# Patient Record
Sex: Male | Born: 1960 | Race: White | Hispanic: No | Smoking: Current every day smoker
Health system: Southern US, Community
[De-identification: ages and names within clinical notes are randomized; demographics above are authoritative.]

## PROBLEM LIST (undated history)

## (undated) DIAGNOSIS — F419 Anxiety disorder, unspecified: Secondary | ICD-10-CM

## (undated) DIAGNOSIS — G43909 Migraine, unspecified, not intractable, without status migrainosus: Secondary | ICD-10-CM

## (undated) DIAGNOSIS — B20 Human immunodeficiency virus [HIV] disease: Secondary | ICD-10-CM

## (undated) DIAGNOSIS — E785 Hyperlipidemia, unspecified: Secondary | ICD-10-CM

---

## 1988-08-25 DIAGNOSIS — B20 Human immunodeficiency virus [HIV] disease: Secondary | ICD-10-CM

## 1988-08-25 DIAGNOSIS — Z21 Asymptomatic human immunodeficiency virus [HIV] infection status: Secondary | ICD-10-CM

## 1988-08-25 HISTORY — DX: Asymptomatic human immunodeficiency virus (hiv) infection status: Z21

## 1988-08-25 HISTORY — DX: Human immunodeficiency virus (HIV) disease: B20

## 2012-11-30 ENCOUNTER — Emergency Department (INDEPENDENT_AMBULATORY_CARE_PROVIDER_SITE_OTHER)
Admission: EM | Admit: 2012-11-30 | Discharge: 2012-11-30 | Disposition: A | Discharge status: Home / Self Care | Payer: Medicare Other | Source: Home / Self Care | Attending: Family Medicine | Admitting: Family Medicine

## 2012-11-30 ENCOUNTER — Emergency Department (INDEPENDENT_AMBULATORY_CARE_PROVIDER_SITE_OTHER): Payer: Medicare Other

## 2012-11-30 ENCOUNTER — Encounter: Payer: Self-pay | Admitting: *Deleted

## 2012-11-30 DIAGNOSIS — J3489 Other specified disorders of nose and nasal sinuses: Secondary | ICD-10-CM

## 2012-11-30 DIAGNOSIS — R05 Cough: Secondary | ICD-10-CM

## 2012-11-30 DIAGNOSIS — R51 Headache: Secondary | ICD-10-CM

## 2012-11-30 DIAGNOSIS — J069 Acute upper respiratory infection, unspecified: Secondary | ICD-10-CM

## 2012-11-30 DIAGNOSIS — R059 Cough, unspecified: Secondary | ICD-10-CM

## 2012-11-30 DIAGNOSIS — B20 Human immunodeficiency virus [HIV] disease: Secondary | ICD-10-CM

## 2012-11-30 HISTORY — DX: Human immunodeficiency virus (HIV) disease: B20

## 2012-11-30 LAB — POCT CBC W AUTO DIFF (K'VILLE URGENT CARE)

## 2012-11-30 MED ORDER — BENZONATATE 200 MG PO CAPS: 200.0000 mg | ORAL_CAPSULE | Freq: Every day | ORAL | Status: AC

## 2012-11-30 MED ORDER — CEFDINIR 300 MG PO CAPS: 300.0000 mg | ORAL_CAPSULE | Freq: Two times a day (BID) | ORAL | Status: AC

## 2012-11-30 NOTE — ED Notes (Signed)
Pt c/o cough, runny nose, HA, and night sweats x 4 days. Denies fever. No OTC meds.

## 2012-11-30 NOTE — ED Provider Notes (Signed)
History     CSN: [id number]  Arrival date & time 11/30/12  4098   First MD Initiated Contact with Patient 11/30/12 1906      Chief Complaint  Patient presents with  . Cough  . Headache  . Nasal Congestion       HPI Comments: Patient complains of onset of URI symptoms about four days ago including mild sore throat, sinus congestion, cough, night sweats, fatigue, and headache.  His cough is non-productive, and worse at night.  No pleuritic pain but has tightness in anterior chest.   He has HIV, having [place]  He continues to followup with his HIV clinic in [place] every 3 months, and would like to find a local HIV clinic.  He states that his condition has been under good control.  His last CD4 count about 4 months ago was 284  The history is provided by the patient.    Past Medical History  Diagnosis Date  . HIV (human immunodeficiency virus infection) 1990    History reviewed. No pertinent past surgical history.  Family History  Problem Relation Age of Onset  . Diabetes Mother   . Non-Hodgkin's lymphoma Mother   . Renal Disease Father     History  Substance Use Topics  . Smoking status: Current Every Day Smoker -- 0.25 packs/day  . Smokeless tobacco: Not on file  . Alcohol Use: No      Review of Systems + sore throat + cough No pleuritic pain, but has mild anterior chest tightness No wheezing + nasal congestion + post-nasal drainage No sinus pain/pressure No itchy/red eyes No earache No hemoptysis No SOB No fever, + chills/sweats No nausea No vomiting No abdominal pain No diarrhea No urinary symptoms No skin rashes + fatigue No myalgias + headache Used OTC meds without relief   Allergies  Bactrim and Sulfa antibiotics  Home Medications   Current Outpatient Rx  Name  Route  Sig  Dispense  Refill  . abacavir-lamiVUDine (EPZICOM) 600-300 MG per tablet   Oral   Take 1 tablet by mouth daily.         Marland Kitchen ALPRAZolam  (XANAX) 1 MG tablet   Oral   Take 1 mg by mouth at bedtime as needed for sleep.         Marland Kitchen atazanavir (REYATAZ) 200 MG capsule   Oral   Take 200 mg by mouth daily with breakfast.         . B Complex-C-Folic Acid (STROVITE PO)   Oral   Take by mouth.         . ergocalciferol (VITAMIN D2) 50000 UNITS capsule   Oral   Take 50,000 Units by mouth once a week.         . maraviroc (SELZENTRY) 300 MG tablet   Oral   Take 300 mg by mouth.         . mometasone (NASONEX) 50 MCG/ACT nasal spray   Nasal   Place 2 sprays into the nose daily.         . rosuvastatin (CRESTOR) 20 MG tablet   Oral   Take 20 mg by mouth daily.         . SUMAtriptan (IMITREX) 20 MG/ACT nasal spray   Nasal   Place 1 spray into the nose every 2 (two) hours as needed for migraine.         Marland Kitchen testosterone (ANDROGEL) 50 MG/5GM GEL   Transdermal   Place 5  g onto the skin daily.         . valACYclovir (VALTREX) 1000 MG tablet   Oral   Take 1,000 mg by mouth 2 (two) times daily.         Marland Kitchen venlafaxine XR (EFFEXOR-XR) 150 MG 24 hr capsule   Oral   Take 150 mg by mouth daily.         . benzonatate (TESSALON) 200 MG capsule   Oral   Take 1 capsule (200 mg total) by mouth at bedtime.   12 capsule   0   . cefdinir (OMNICEF) 300 MG capsule   Oral   Take 1 capsule (300 mg total) by mouth 2 (two) times daily.   20 capsule   0     BP 124/70  Pulse 67  Temp(Src) 98.2 F (36.8 C) (Oral)  Resp 16  Ht 5' 10.5" (1.791 m)  Wt 161 lb (73.029 kg)  BMI 22.77 kg/m2  SpO2 97%  Physical Exam Nursing notes and Vital Signs reviewed. Appearance:  Patient appears stated age, and in no acute distress Eyes:  Pupils are equal, round, and reactive to light and accomodation.  Extraocular movement is intact.  Conjunctivae are not inflamed  Ears:  Canals normal.  Tympanic membranes normal.  Nose:  Mildly congested turbinates.  No sinus tenderness.   Mouth:  Partly edentulous Pharynx:  Normal Neck:   Supple.  Slightly tender shotty posterior nodes are palpated bilaterally  Lungs:  Clear to auscultation.  Breath sounds are equal.  Chest:   Mild tenderness to palpation over the mid-sternum.  Heart:  Regular rate and rhythm without murmurs, rubs, or gallops.  Abdomen:  Nontender without masses or hepatosplenomegaly.  Bowel sounds are present.  No CVA or flank tenderness.  Extremities:  No edema.  No calf tenderness Skin:  No rash present.    ED Course  Procedures  none  Labs Reviewed  LACTATE DEHYDROGENASE 197 (WNL)  POCT CBC W AUTO DIFF ([facility]) :  WBC 9.0; LY 45.5; MO 3.0; GR 51.5; Hgb 11.9; Platelets 233    Diagnosis: 1.  Acute upper respiratory infection of unspecified site; suspect viral URI 2.  Human immunodeficiency virus (HIV) disease.   Patient's normal white blood count is reassuring. With a total lymphocyte count of 4100, patient's CD4 count should be greater than 200.  Note mild anemia (Hgb 11.9)       MDM  Empirically begin Omnicef.  Prescription written for Benzonatate [facility]) to take at bedtime for night-time cough.   Take plain Mucinex (guaifenesin) twice daily for cough and congestion.  Increase fluid intake, rest. May use Afrin nasal spray (or generic oxymetazoline) twice daily for about 5 days.  Also recommend using saline nasal spray several times daily and saline nasal irrigation (AYR is a common brand) Stop all antihistamines for now, and other non-prescription cough/cold preparations. Followup with Family Doctor if not improved in about 6 days.  Will attempt to arrange local follow-up HIV care for patient       Lattie Haw, MD 12/04/12 1845

## 2012-12-01 LAB — LACTATE DEHYDROGENASE: LDH: 197 U/L (ref 94–250)

## 2012-12-05 ENCOUNTER — Telehealth: Payer: Self-pay | Admitting: Emergency Medicine

## 2013-01-25 ENCOUNTER — Emergency Department (INDEPENDENT_AMBULATORY_CARE_PROVIDER_SITE_OTHER)
Admission: EM | Admit: 2013-01-25 | Discharge: 2013-01-25 | Disposition: A | Discharge status: Home / Self Care | Payer: Medicare Other | Source: Home / Self Care | Attending: Family Medicine | Admitting: Family Medicine

## 2013-01-25 ENCOUNTER — Encounter: Payer: Self-pay | Admitting: *Deleted

## 2013-01-25 DIAGNOSIS — S61209A Unspecified open wound of unspecified finger without damage to nail, initial encounter: Secondary | ICD-10-CM

## 2013-01-25 DIAGNOSIS — Z23 Encounter for immunization: Secondary | ICD-10-CM

## 2013-01-25 DIAGNOSIS — S61211A Laceration without foreign body of left index finger without damage to nail, initial encounter: Secondary | ICD-10-CM

## 2013-01-25 MED ORDER — TETANUS-DIPHTH-ACELL PERTUSSIS 5-2.5-18.5 LF-MCG/0.5 IM SUSP
0.5000 mL | Freq: Once | INTRAMUSCULAR | Status: AC
  Administered 2013-01-25: 0.5 mL via INTRAMUSCULAR

## 2013-01-25 NOTE — ED Notes (Signed)
Pt c/o LT 2nd finger laceration x today. He reports cutting it with a pair of scissors at work. Tdap is not up to date.

## 2013-01-25 NOTE — ED Provider Notes (Signed)
History     CSN: [id number]  Arrival date & time 01/25/13  1253   First MD Initiated Contact with Patient 01/25/13 1312      Chief Complaint  Patient presents with  . 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  His Tdap is not current.  Patient is a 52 y.o. male presenting with skin laceration. The history is provided by the patient.  Laceration Location:  Finger Finger laceration location:  L index finger Length (cm):  1cm by 5mm Depth:  Through dermis Quality comment:  Flap Bleeding: controlled   Time since incident:  1 hour Injury mechanism: sharp scissors. Pain details:    Quality:  Aching   Severity:  Mild   Timing:  Constant   Progression:  Unchanged Foreign body present:  No foreign bodies Relieved by:  Nothing Tetanus status:  Out of date   Past Medical History  Diagnosis Date  . HIV (human immunodeficiency virus infection) 1990    History reviewed. No pertinent past surgical history.  Family History  Problem Relation Age of Onset  . Diabetes Mother   . Non-Hodgkin's lymphoma Mother   . Renal Disease Father     History  Substance Use Topics  . Smoking status: Current Every Day Smoker -- 0.25 packs/day  . Smokeless tobacco: Not on file  . Alcohol Use: No      Review of Systems  All other systems reviewed and are negative.    Allergies  Bactrim and Sulfa antibiotics  Home Medications   Current Outpatient Rx  Name  Route  Sig  Dispense  Refill  . abacavir-lamiVUDine (EPZICOM) 600-300 MG per tablet   Oral   Take 1 tablet by mouth daily.         Marland Kitchen ALPRAZolam (XANAX) 1 MG tablet   Oral   Take 1 mg by mouth at bedtime as needed for sleep.         Marland Kitchen atazanavir (REYATAZ) 200 MG capsule   Oral   Take 200 mg by mouth daily with breakfast.         . B Complex-C-Folic Acid (STROVITE PO)   Oral   Take by mouth.         .  benzonatate (TESSALON) 200 MG capsule   Oral   Take 1 capsule (200 mg total) by mouth at bedtime.   12 capsule   0   . cefdinir (OMNICEF) 300 MG capsule   Oral   Take 1 capsule (300 mg total) by mouth 2 (two) times daily.   20 capsule   0   . ergocalciferol (VITAMIN D2) 50000 UNITS capsule   Oral   Take 50,000 Units by mouth once a week.         . maraviroc (SELZENTRY) 300 MG tablet   Oral   Take 300 mg by mouth.         . mometasone (NASONEX) 50 MCG/ACT nasal spray   Nasal   Place 2 sprays into the nose daily.         . rosuvastatin (CRESTOR) 20 MG tablet   Oral   Take 20 mg by mouth daily.         . SUMAtriptan (IMITREX) 20 MG/ACT nasal spray   Nasal   Place 1 spray into the nose every 2 (two) hours as needed for migraine.         Marland Kitchen  testosterone (ANDROGEL) 50 MG/5GM GEL   Transdermal   Place 5 g onto the skin daily.         . valACYclovir (VALTREX) 1000 MG tablet   Oral   Take 1,000 mg by mouth 2 (two) times daily.         Marland Kitchen venlafaxine XR (EFFEXOR-XR) 150 MG 24 hr capsule   Oral   Take 150 mg by mouth daily.           BP 125/77  Pulse 64  Temp(Src) 99.1 F (37.3 C) (Oral)  Resp 18  SpO2 98%  Physical Exam  Nursing note and vitals reviewed. Constitutional: He is oriented to person, place, and time. He appears well-developed and well-nourished. No distress.  HENT:  Head: Atraumatic.  Eyes: Conjunctivae are normal. Pupils are equal, round, and reactive to light.  Musculoskeletal:       Left hand: He exhibits laceration. He exhibits normal range of motion, no tenderness, no bony tenderness, normal two-point discrimination, normal capillary refill, no deformity and no swelling. Normal sensation noted. Normal strength noted.       Hands: Left hand, second finger has a superficial narrow flap laceration distal phalanx ulnar side.  The flap is about 1cm long.  Left second finger has full range of motion all joints.  Distal neurovascular  function is intact.   Neurological: He is alert and oriented to person, place, and time.  Skin: Skin is warm and dry.    ED Course  Procedures  Laceration Repair Discussed benefits and risks of procedure and verbal consent obtained. Using sterile technique and local digital 2% lidocaine without epinephrine, cleansed wound with Betadine followed by copious lavage with normal saline.  Wound carefully inspected for debris and foreign bodies; none found.  Wound closed with #6, 5-0 interrupted nylon sutures.  Bacitracin and non-stick sterile dressing applied.  Wound precautions explained to patient.  Return for suture removal in 10 days.       1. Laceration of index finger of left hand without complication, initial encounter       MDM  Tdap administered. Change dressing daily and apply Bacitracin ointment to wound.  Keep wound clean and dry.  Return for any signs of infection (or follow-up with family doctor):  Increasing redness, swelling, pain, heat, drainage, etc. Return in 10 days for suture removal.          Lattie Haw, MD 01/25/13 1414

## 2013-02-07 ENCOUNTER — Emergency Department
Admission: EM | Admit: 2013-02-07 | Discharge: 2013-02-07 | Disposition: A | Discharge status: Home / Self Care | Payer: Medicare Other | Source: Home / Self Care | Attending: Family Medicine | Admitting: Family Medicine

## 2013-02-07 DIAGNOSIS — Z4802 Encounter for removal of sutures: Secondary | ICD-10-CM

## 2013-02-07 NOTE — ED Notes (Signed)
Shawn Jimenez is here for suture removal from left index finger. Site is unremarkable without drainage.

## 2013-02-07 NOTE — ED Provider Notes (Signed)
History     CSN: [id number]  Arrival date & time 02/07/13  1514   First MD Initiated Contact with Patient 02/07/13 1519      Chief Complaint  Patient presents with  . Suture / Staple Removal   HPI Comments: No redness, purulent drainage, fevers, chills   Patient is a 52 y.o. male presenting with suture removal.  Suture / Staple Removal The current episode started more than 1 week ago. Nothing aggravates the symptoms.     Past Medical History  Diagnosis Date  . HIV (human immunodeficiency virus infection) 1990    No past surgical history on file.  Family History  Problem Relation Age of Onset  . Diabetes Mother   . Non-Hodgkin's lymphoma Mother   . Renal Disease Father     History  Substance Use Topics  . Smoking status: Current Every Day Smoker -- 0.25 packs/day  . Smokeless tobacco: Not on file  . Alcohol Use: No      Review of Systems  All other systems reviewed and are negative.    Allergies  Bactrim and Sulfa antibiotics  Home Medications   Current Outpatient Rx  Name  Route  Sig  Dispense  Refill  . abacavir-lamiVUDine (EPZICOM) 600-300 MG per tablet   Oral   Take 1 tablet by mouth daily.         Marland Kitchen ALPRAZolam (XANAX) 1 MG tablet   Oral   Take 1 mg by mouth at bedtime as needed for sleep.         Marland Kitchen atazanavir (REYATAZ) 200 MG capsule   Oral   Take 200 mg by mouth daily with breakfast.         . B Complex-C-Folic Acid (STROVITE PO)   Oral   Take by mouth.         . benzonatate (TESSALON) 200 MG capsule   Oral   Take 1 capsule (200 mg total) by mouth at bedtime.   12 capsule   0   . cefdinir (OMNICEF) 300 MG capsule   Oral   Take 1 capsule (300 mg total) by mouth 2 (two) times daily.   20 capsule   0   . ergocalciferol (VITAMIN D2) 50000 UNITS capsule   Oral   Take 50,000 Units by mouth once a week.         . maraviroc (SELZENTRY) 300 MG tablet   Oral   Take 300 mg by mouth.         . mometasone (NASONEX) 50  MCG/ACT nasal spray   Nasal   Place 2 sprays into the nose daily.         . rosuvastatin (CRESTOR) 20 MG tablet   Oral   Take 20 mg by mouth daily.         . SUMAtriptan (IMITREX) 20 MG/ACT nasal spray   Nasal   Place 1 spray into the nose every 2 (two) hours as needed for migraine.         Marland Kitchen testosterone (ANDROGEL) 50 MG/5GM GEL   Transdermal   Place 5 g onto the skin daily.         . valACYclovir (VALTREX) 1000 MG tablet   Oral   Take 1,000 mg by mouth 2 (two) times daily.         Marland Kitchen venlafaxine XR (EFFEXOR-XR) 150 MG 24 hr capsule   Oral   Take 150 mg by mouth daily.  Temp(Src) 98.4 F (36.9 C) (Oral)  Physical Exam  Constitutional: He appears well-developed and well-nourished.  HENT:  Head: Normocephalic and atraumatic.  Eyes: Conjunctivae are normal. Pupils are equal, round, and reactive to light.  Neck: Normal range of motion.  Cardiovascular: Normal rate and regular rhythm.   Pulmonary/Chest: Effort normal.  Abdominal: Soft.  Musculoskeletal: Normal range of motion.  Neurological: He is alert.  Skin: Skin is warm.    ED Course  Procedures (including critical care time)  Labs Reviewed - No data to display No results found.   1. Visit for suture removal       MDM  Suture removal at bedside. Discussed infectious red flags  Follow up as needed.     The patient and/or caregiver has been counseled thoroughly with regard to treatment plan and/or medications prescribed including dosage, schedule, interactions, rationale for use, and possible side effects and they verbalize understanding. Diagnoses and expected course of recovery discussed and will return if not improved as expected or if the condition worsens. Patient and/or caregiver verbalized understanding.             Doree Albee, MD 02/07/13 [phone]

## 2013-12-04 ENCOUNTER — Encounter: Payer: Self-pay | Admitting: Emergency Medicine

## 2013-12-04 ENCOUNTER — Emergency Department (INDEPENDENT_AMBULATORY_CARE_PROVIDER_SITE_OTHER)
Admission: EM | Admit: 2013-12-04 | Discharge: 2013-12-04 | Disposition: A | Discharge status: Home / Self Care | Payer: Medicare Other | Source: Home / Self Care | Attending: Family Medicine | Admitting: Family Medicine

## 2013-12-04 DIAGNOSIS — H6090 Unspecified otitis externa, unspecified ear: Secondary | ICD-10-CM

## 2013-12-04 DIAGNOSIS — H6690 Otitis media, unspecified, unspecified ear: Secondary | ICD-10-CM

## 2013-12-04 HISTORY — DX: Anxiety disorder, unspecified: F41.9

## 2013-12-04 HISTORY — DX: Hyperlipidemia, unspecified: E78.5

## 2013-12-04 HISTORY — DX: Migraine, unspecified, not intractable, without status migrainosus: G43.909

## 2013-12-04 MED ORDER — NEOMYCIN-POLYMYXIN-HC 3.5-10000-1 OT SOLN: 3.0000 [drp] | Freq: Four times a day (QID) | OTIC | Status: AC

## 2013-12-04 MED ORDER — NEOMYCIN-POLYMYXIN-HC 3.5-10000-1 OT SOLN: 3.0000 [drp] | Freq: Four times a day (QID) | OTIC | Status: DC

## 2013-12-04 MED ORDER — AMOXICILLIN-POT CLAVULANATE 875-125 MG PO TABS: 1.0000 | ORAL_TABLET | Freq: Two times a day (BID) | ORAL | Status: AC

## 2013-12-04 NOTE — ED Provider Notes (Addendum)
CSN: [id number]     Arrival date & time 12/04/13  1611 History   First MD Initiated Contact with Patient 12/04/13 1611     Chief Complaint  Patient presents with  . Ear Fullness    HPI  EAR PAIN Location:  R ear  Description: R ear pain, fullness Onset:  3-5 days  Modifying factors: baseline HIV, last CD4 count >550 Symptoms  Sensation of fullness: yes Ear discharge: no URI symptoms: no  Fever: no Tinnitus:no   Dizziness:no   Hearing loss:minimal    Toothache: no Rashes or lesions: no Facial muscle weakness: no  Red Flags Recent trauma: no PMH prior ear surgery:  no Diabetes or Immunosuppresion: yes    Past Medical History  Diagnosis Date  . HIV (human immunodeficiency virus infection) 1990   No past surgical history on file. Family History  Problem Relation Age of Onset  . Diabetes Mother   . Non-Hodgkin's lymphoma Mother   . Renal Disease Father    History  Substance Use Topics  . Smoking status: Current Every Day Smoker -- 0.25 packs/day  . Smokeless tobacco: Not on file  . Alcohol Use: No    Review of Systems  All other systems reviewed and are negative.   Allergies  Bactrim and Sulfa antibiotics  Home Medications   Current Outpatient Rx  Name  Route  Sig  Dispense  Refill  . abacavir-lamiVUDine (EPZICOM) 600-300 MG per tablet   Oral   Take 1 tablet by mouth daily.         Marland Kitchen ALPRAZolam (XANAX) 1 MG tablet   Oral   Take 1 mg by mouth at bedtime as needed for sleep.         Marland Kitchen amoxicillin-clavulanate (AUGMENTIN) 875-125 MG per tablet   Oral   Take 1 tablet by mouth 2 (two) times daily.   20 tablet   0   . atazanavir (REYATAZ) 200 MG capsule   Oral   Take 200 mg by mouth daily with breakfast.         . B Complex-C-Folic Acid (STROVITE PO)   Oral   Take by mouth.         . benzonatate (TESSALON) 200 MG capsule   Oral   Take 1 capsule (200 mg total) by mouth at bedtime.   12 capsule   0   . cefdinir (OMNICEF) 300 MG  capsule   Oral   Take 1 capsule (300 mg total) by mouth 2 (two) times daily.   20 capsule   0   . ergocalciferol (VITAMIN D2) 50000 UNITS capsule   Oral   Take 50,000 Units by mouth once a week.         . maraviroc (SELZENTRY) 300 MG tablet   Oral   Take 300 mg by mouth.         . mometasone (NASONEX) 50 MCG/ACT nasal spray   Nasal   Place 2 sprays into the nose daily.         Marland Kitchen neomycin-polymyxin-hydrocortisone (CORTISPORIN) otic solution   Right Ear   Place 3 drops into the right ear 4 (four) times daily.   10 mL   0   . rosuvastatin (CRESTOR) 20 MG tablet   Oral   Take 20 mg by mouth daily.         . SUMAtriptan (IMITREX) 20 MG/ACT nasal spray   Nasal   Place 1 spray into the nose every 2 (two) hours as needed for migraine.         Marland Kitchen  testosterone (ANDROGEL) 50 MG/5GM GEL   Transdermal   Place 5 g onto the skin daily.         . valACYclovir (VALTREX) 1000 MG tablet   Oral   Take 1,000 mg by mouth 2 (two) times daily.         Marland Kitchen. venlafaxine XR (EFFEXOR-XR) 150 MG 24 hr capsule   Oral   Take 150 mg by mouth daily.          There were no vitals taken for this visit. Physical Exam  Constitutional: He appears well-developed and well-nourished.  HENT:  Head: Normocephalic and atraumatic.  Left Ear: External ear normal.  R ear canal erythema, TM bulging, tenderness to otoscopic evaluation   Eyes: Conjunctivae are normal. Pupils are equal, round, and reactive to light.  Neck: Normal range of motion. Neck supple.  Cardiovascular: Normal rate and regular rhythm.   Pulmonary/Chest: Effort normal and breath sounds normal.  Abdominal: Soft.  Musculoskeletal: Normal range of motion.  Neurological: He is alert.  Skin: Skin is warm.    ED Course  Procedures (including critical care time) Labs Review Labs Reviewed - No data to display Imaging Review No results found.   MDM   1. AOM (acute otitis media)   2. OE (otitis externa)    Will treat  with cortisporin and augmentin  Discussed infectious and ENT red flags at length.  Follow up with ENT if sxs persist.    The patient and/or caregiver has been counseled thoroughly with regard to treatment plan and/or medications prescribed including dosage, schedule, interactions, rationale for use, and possible side effects and they verbalize understanding. Diagnoses and expected course of recovery discussed and will return if not improved as expected or if the condition worsens. Patient and/or caregiver verbalized understanding.         Doree AlbeeSteven Tong Pieczynski, MD 12/04/13 1648  Doree AlbeeSteven Raneisha Bress, MD 12/04/13 [phone]

## 2013-12-04 NOTE — ED Notes (Signed)
Reports 5 day history of fullness sensation in right ear; denies pain; suspects hearing somewhat impaired since onset.

## 2014-01-09 IMAGING — CR DG CHEST 2V
2 series · 2 of 2 positions shown · non-contrast
Comparison: None.

CLINICAL DATA: Cough, headache, congestion

CHEST - 2 VIEW

[view not recorded (1 of 2)]
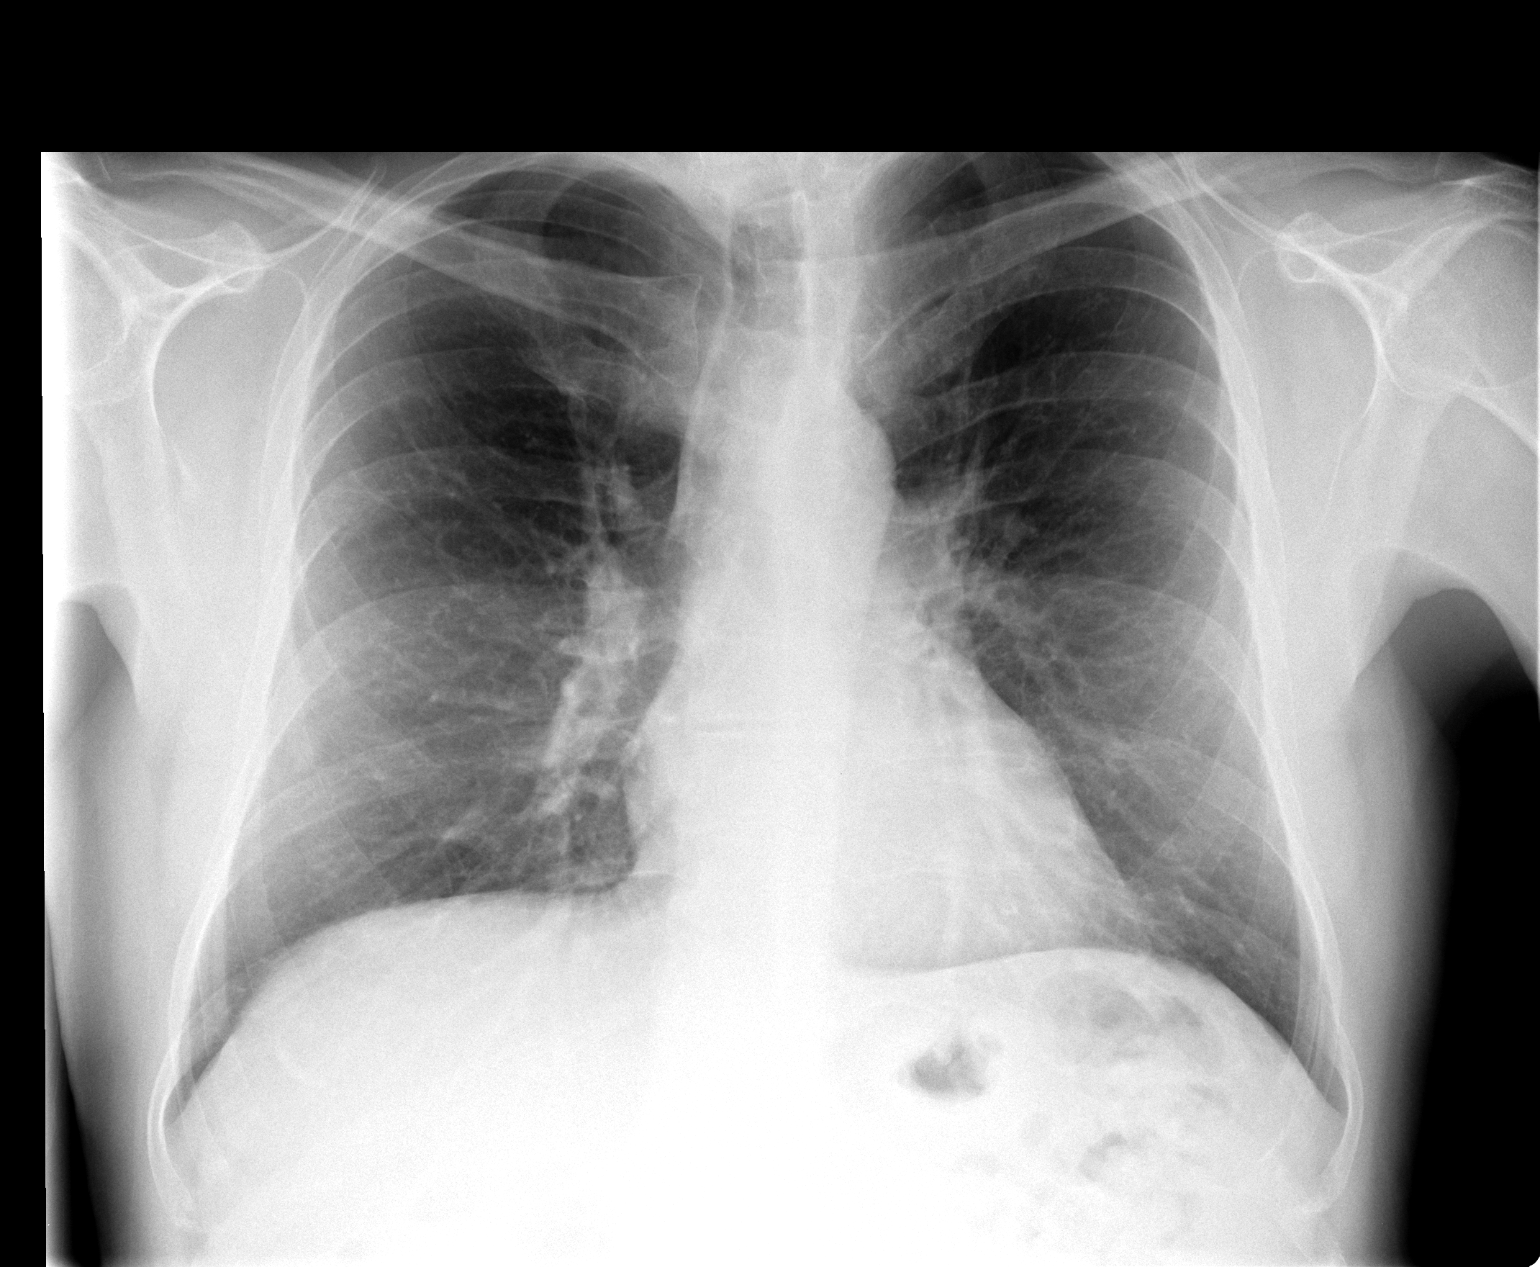

[view not recorded (2 of 2)]
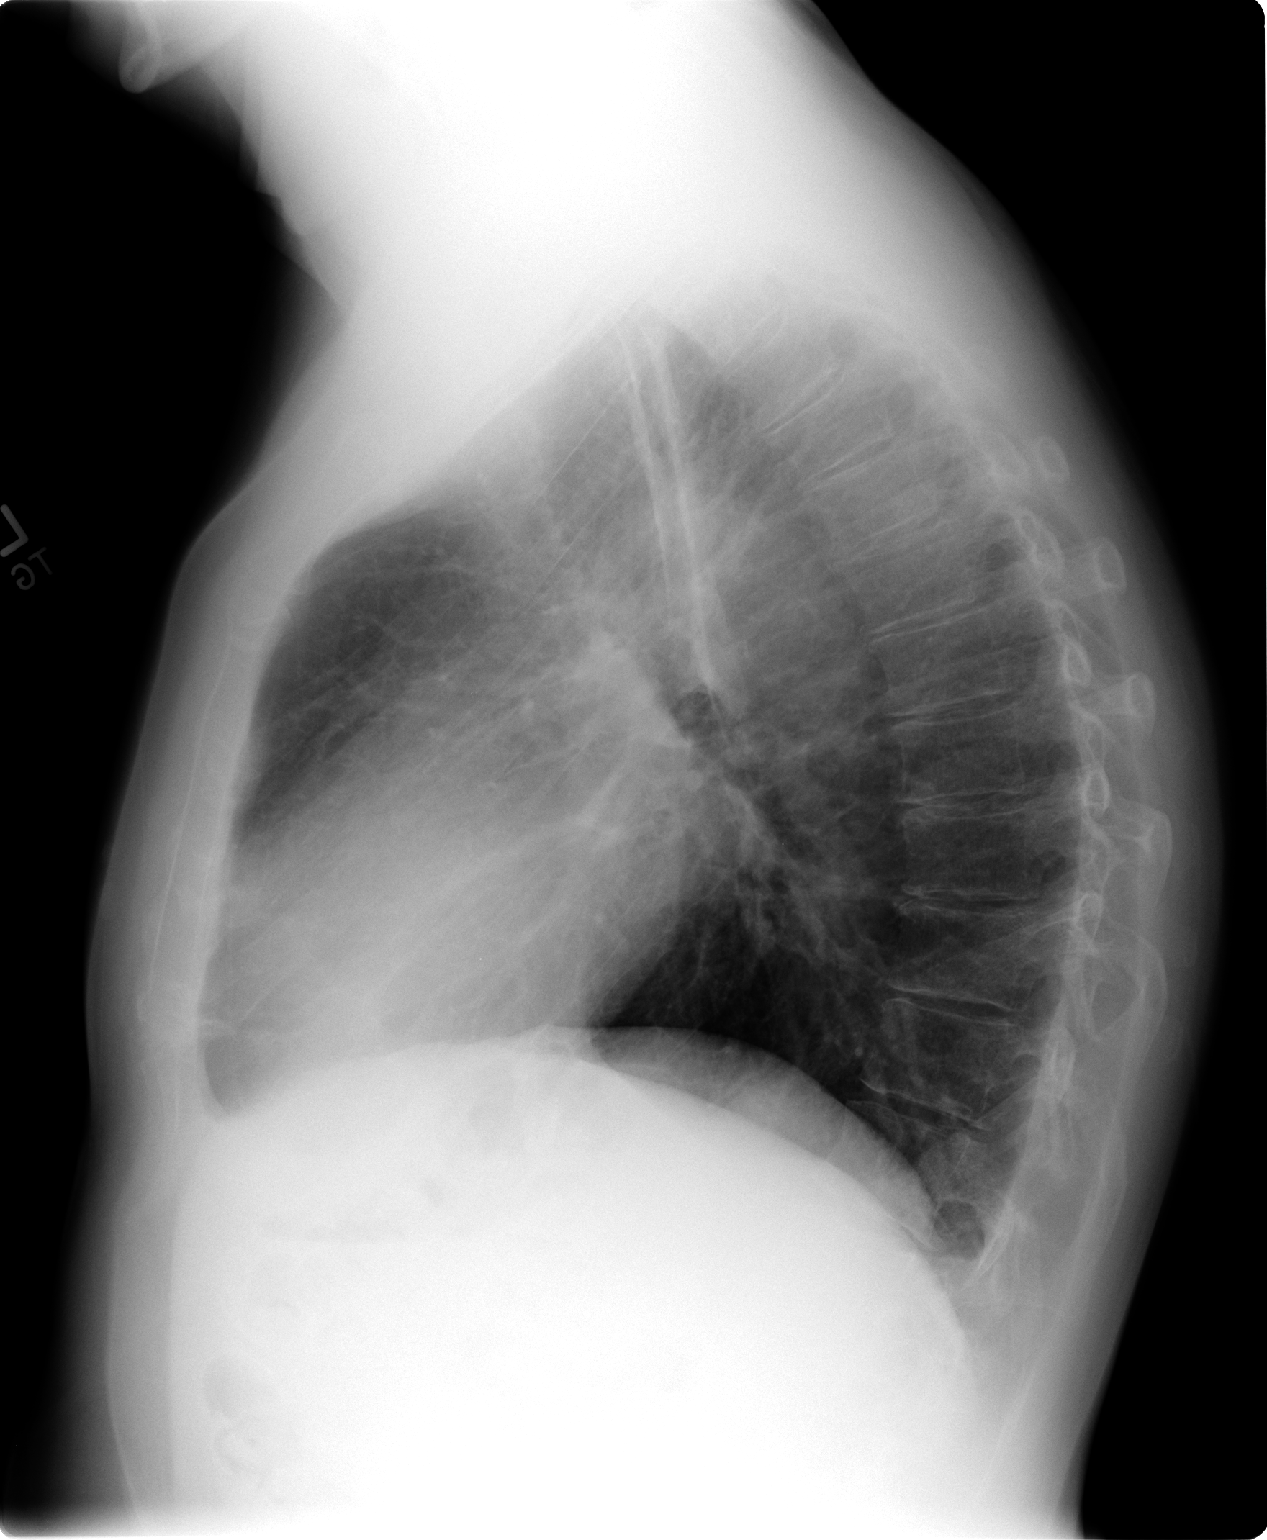

[2 of 2 positions shown; findings below may reference images not displayed]

FINDINGS: The heart size and mediastinal contours are within
normal limits.  Both lungs are clear.  The visualized skeletal
structures are unremarkable.
IMPRESSION: No active cardiopulmonary disease.

## 2022-11-17 ENCOUNTER — Ambulatory Visit
Admit: 2022-11-17 | Payer: PRIVATE HEALTH INSURANCE | Attending: Student in an Organized Health Care Education/Training Program | Primary: Infectious Disease

## 2023-02-13 ENCOUNTER — Ambulatory Visit: Admit: 2023-02-13 | Payer: PRIVATE HEALTH INSURANCE | Attending: Internal Medicine | Primary: Infectious Disease

## 2023-02-13 ENCOUNTER — Encounter: Admit: 2023-02-13 | Payer: PRIVATE HEALTH INSURANCE | Primary: Infectious Disease

## 2023-02-13 DIAGNOSIS — E538 Deficiency of other specified B group vitamins: Secondary | ICD-10-CM

## 2023-02-13 DIAGNOSIS — G479 Sleep disorder, unspecified: Secondary | ICD-10-CM

## 2023-02-13 DIAGNOSIS — D509 Iron deficiency anemia, unspecified: Secondary | ICD-10-CM

## 2023-02-13 MED ORDER — DOVATO 50 MG-300 MG TABLET
50-300 mg | Freq: Every day | ORAL | Status: AC
Start: 2023-02-13 — End: ?

## 2023-02-13 MED ORDER — CLONAZEPAM 1 MG TABLET
1 mg | Freq: Two times a day (BID) | ORAL | Status: AC
Start: 2023-02-13 — End: ?

## 2023-02-13 NOTE — Unmapped
[facility] [address] [place], [place] [id number], [address], [place], [place] [address]XBJYN: [phone]         Fax: [name], MD - Hermine Messick, MBA, PA-C - Steffanie Dunn, MD - Lorenza Evangelist, MD, PhDBrian Pixie Casino, MD - Jolayne Haines, MD - Vahid Mohsenin, MDLynelle Jackson, PsyD - Wynona Neat, MD - Donovan Kail, MD, MS, Medical Director Valora Piccolo, MD, MPH, DirectorPatient Name:  Andrew Charles of Birth:  May 06, 1962Date of Service: 6/21/2024EPIC MRN:  [id number] Referring provider: No ref. provider foundPrimary care provider: Raelyn Ensign Consultant:  Ortencia Kick, PAPatient Care Team:Patient Care Team:Blick, Jillyn Hidden, MD as PCP - General (Internal Medicine) Reason for Visit I had the pleasure of meeting Andrew Charles in consultation from Dr. No ref. provider found regarding chief complaint of Insomnia He has not previously undergone sleep evaluation.History of Present Illness Andrew Charles is a 62 y.o. English-speaking gentleman depression who reports that his main sleep-related concerns are Worsening Insomnia x 2 years Sleep WNU:UVOZDGU:YQIHK been told Witnessed or perceived pauses: noChoking Gagging: noIs sleep refreshing? noDo you feel sleepy during the day?yesCaffeine used to stay awake?soda 4 bottle Headaches:yes on triptan for migraines Dry mouth: yesGasping awakenings: he vapes so a little sob Nocturnal heartburn: noPain interfering with sleep: noNocturia: sometimes Palpitations: yesLeg cramps/RLS symptoms/ Leg movements? Yes rls , starts as soon as he gets in bed Sleep walking/Dream enactment/Nocturnal eating/Sleep talking: dn sleep walking Bruxism: no Memory loss/attention issues/depression/anxiety: depression bereavement anxiety PA , no TBI , no PTSD , mild forgetfulness Nightmares or vivid dreams: yes hx vivid dreams Hynogogic/pompic hallucination/cataplexy/sleep paralysis: no spPast/Childhood Sleep History: Insomnia Current sleep-wake history:Location: bedromBed/Recliner/Wedge: bed # of pillows under head: two Bedpartner: alone x 4 years Sleep position: lateral Pre bedtime routine watches tv phone use tik tok in bed by 11 pm Bedtime: 11 pm Sleep latency: many hours rumination Rise time:  730am no alarm needed Out of bed: 730 am Awakenings:  sometimes to void sometimes just wakes up and can't fall back to sleep Estimated nocturnal sleep time: 4 hours On awakening in AM:  un  refreshedNapping: sometimes takes a nap 20 minutes and refreshing  Drowsy driving: no Sleep aids: tried [place] worked great initially then stopped working , seroquel did not work , temazepam , never tried Education administrator or belsomra Just started exercisesVapes No alcohol use.Tried cbti for six months Epworth Sleepiness Score was Epworth Total Score: 4 today. A typical sleep apnea patient will have the score varying between 10 and 13, which is severe sleepiness, and values above that are considered extremely severe sleepiness. Examples of the impact of sleepiness: Review of Systems  No to all Yes to checked items Comments CONST []   [] weight gain  [] weight loss  [] fatigue  EYES []   [] dry eyes  [] ?vision  [] glasses/contacts   ENT []   [] nasal congestion  [] nosebleeds  [] nasal fracture  [] postnasal drip [] difficulty swallowing  [] dry throat [] hoarseness  [] ?hearing  [] dentures [] dental problems [] tooth grinding  CV [x]   [] chest pain   [] palpitations   [] leg swelling  RESP []   [x] shortness of breath  [] cough  [] wheezing  GI [x]   [] heartburn/reflux [] Irritable bowel  [] GI bleeding  GU [x]   [] urination at night [] bedwetting  [] frequency [] heavy menses [] pregnant [] menopausal sympt  [] postmenopause[] erectile dysfunction SKIN []   [] rash  [] skin irritation  [] blue fingers/toes   MS []   [] body aches   [] back pain  [] joint pain  NEURO []   [  x]headaches  [] seizures  [] muscle weakness  PSYCH []   [x] depression  [] anxiety  [] panic   [] claustrophobia  ENDO []   [] night sweats   [] heat intolerance [] cold intolerance  HEME []   [] anemia  [] iron deficiency  [] easy bruising  ALL/IM []   [] environmental allergy  [] food allergy  [] HIV positive  A 14-point system review of systems was otherwise negative.Immunization History Administered Date(s) Administered  Hep A, adult 08/29/2003  Hep B, adult 08/29/2003, 10/24/2003  Influenza, seasonal, injectable 06/23/2003, 06/21/2004  Pneumococcal polysaccharide PPSV23 08/29/2003  Td 09/26/2003 Past Medical History Andrew Charles  has a past medical history of Anxiety, Depression, HIV disease (HC Code), and Immune deficiency disorder (HC Code).Past Surgical History Andrew Charles  has no past surgical history on file.  Medications Outpatient Encounter Medications as of 02/13/2023 Medication Sig Dispense Refill  abacavir-dolutegravir-lamivud (TRIUMEQ) 600-50-300 mg Tab Take 600 mg by mouth daily.    ALPRAZolam (XANAX) 0.5 MG tablet Take 1 tablet (0.5 mg total) by mouth nightly as needed for anxiety.    cephALEXin (KEFLEX) 500 MG capsule Take 1 capsule (500 mg total) by mouth 4 (four) times daily.    maraviroc (SELZENTRY) 300 mg Tab Take 1 tablet (300 mg total) by mouth daily.    escitalopram oxalate (LEXAPRO) 10 MG tablet Take 2 tablets (20 mg total) by mouth Every morning @0700 .. 60 tablet 2 No facility-administered encounter medications on file as of 02/13/2023. Allergies He is allergic to sulfa (sulfonamide antibiotics). Family History Andrew Charles family history includes ADHD in his brother; Anxiety disorder in his sister; Bipolar disorder in his brother; Depression in his sister. There is family history of sleep disorders or sleep-disordered breathing.Insomnia sisters Social History Andrew Charles works Social worker .  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  He consumes [employer] per day.  He  reports that he has quit smoking. His smoking use included cigarettes. He does not have any smokeless tobacco history on file. He reports that he does not drink alcohol and does not use drugs. Partner passed a few years ago Social History Socioeconomic History  Marital status: Single Tobacco Use  Smoking status: Former   Types: Cigarettes Substance and Sexual Activity  Alcohol use: No  Drug use: No Physical Examination Blood pressure 118/77, pulse 76, temperature 98 ?F (36.7 ?C), temperature source No-touch scanner, resp. rate 20, height 5' 11 (1.803 m), weight 84.4 kg, SpO2 96 %. on room airBody mass index is 25.94 kg/m?.  02/13/2023   8:00 AM Neck Circumference  Neck Circumference 16.5 General: Awake, alert, in no distress,ORAL: High and narrow hard palate: noElongated soft palate/uvula: yesMallampati:IVNeck: Flexible without adenopathy, thyromegaly, or massesLungs: Clear to auscultation and percussion. Heart: Regular S1-S2, no murmur Extremities: No cyanosis or clubbing, no pedal edema Skin: Warm, dry, well-perfusedNeuropsychiatric: Alert, interactive, and appropriate. Affect normal. Speech fluent.Data Review PRIOR SLEEP TESTING: none LABS:No results found for: WBC, HGB, HCT, MCV, PLTNo results found for: CREATININE, BUN, NA, K, CL, CO2Assessment Wane Charles is a 62 y.o. man   With history of depression  who presents with chronic Insomnia RLS sleep disturbance headache xerostomia forgetfulness fragmented sleep and non restorative sleep , given his history and symptoms of  I have a  suspicion of OSA. I will order a in lab  sleep study to evaluate and contact the patient with results about 2 weeks after. The patient is agreeable to PAP therapy if indicated. Discussed the pathophysiology of OSA Check b12 and ferritin hx of RLSDiscussed the stimulating effects of soda  and vaping with can worsen insomnia and RLSDiscussed sleep hygiene and apps can use A HST would be inconclusive in the setting of severe Insomnia ISI =24Plan - He will be scheduled for in-laboratory polysomnography- We discussed the cardiovascular and cerebrovascular risks of untreated OSA.- Weight management was discussed and advised- All of his questions were answered.It was a pleasure to meet Andrew Charles today.  We will contact him with the results of sleep testing and arrange appropriate follow-up thereafter.He will return for follow-up  after sleep testing.Thank you for involving me in the care of this patient.  Please do not hesitate to call me if you would like to discuss Andrew Charles care.Kathi Der MHS;  NPI: [id number] Electronically signed February 13, 2023 8:57 AM

## 2023-02-14 DIAGNOSIS — R0683 Snoring: Secondary | ICD-10-CM

## 2023-02-26 ENCOUNTER — Encounter: Admit: 2023-02-26 | Payer: PRIVATE HEALTH INSURANCE | Primary: Infectious Disease

## 2023-03-01 LAB — IRON AND TIBC
IRON BINDING CAPACITY: 290 ug/dL (ref 250–425)
SATURATION RATIOS: 22 % (ref 20–48)

## 2023-03-01 LAB — FERRITIN: FERRITIN: 99 ng/mL (ref 24–380)

## 2023-03-01 LAB — VITAMIN B12: VITAMIN B-12: 365 pg/mL (ref 200–1100)

## 2023-03-03 ENCOUNTER — Ambulatory Visit: Admit: 2023-03-03 | Payer: PRIVATE HEALTH INSURANCE | Primary: Infectious Disease

## 2023-03-25 ENCOUNTER — Telehealth: Admit: 2023-03-25 | Payer: PRIVATE HEALTH INSURANCE | Attending: Internal Medicine | Primary: Infectious Disease

## 2023-03-25 NOTE — Telephone Encounter
Received call from patient requesting 03/03/23 PSG results. Informed patient turn around time is currently about 4-8 weeks for results. Encouraged to call us back if results not received before then (end of Aug). Review of record indicates study was scored and routed to Dr Kendal Hymen on 7/30. Message sent to Providers Cheryln Manly)

## 2023-04-22 ENCOUNTER — Ambulatory Visit
Admit: 2023-04-22 | Payer: PRIVATE HEALTH INSURANCE | Attending: Student in an Organized Health Care Education/Training Program | Primary: Infectious Disease

## 2023-05-08 ENCOUNTER — Telehealth: Admit: 2023-05-08 | Payer: PRIVATE HEALTH INSURANCE | Attending: Internal Medicine | Primary: Infectious Disease

## 2023-05-08 NOTE — Telephone Encounter
 Andrew Charles called in this morning requesting his sleep study results. He states he never received them. Study was done on 7/9. He is requesting a call back.

## 2023-05-13 ENCOUNTER — Telehealth: Admit: 2023-05-13 | Payer: PRIVATE HEALTH INSURANCE | Attending: Internal Medicine | Primary: Infectious Disease

## 2023-05-13 NOTE — Telephone Encounter
 I called pt to go over in lab psg no answer left a message and will call back in the afternoon.SEVERE CSA No echo on fileHe will need an in lab titration study -- bipap ST -- need for ASVWill need echo .

## 2023-05-13 NOTE — Procedures
 [facility] [address] [place], [place] [id number] [address]), [place], [place] [address]WJXBJ: [phone] Fax: [name], MD - Caralyn Guile, MD - Hildred Alamin, MD - Lorenza Evangelist, MD, PhD -            	Wonda Amis, MD - Hadassah Pais, MD, - Lolita Lenz PhD - Camille Bal, PsyD, - Arsenio Katz, MD -  Yong Channel - Cheral Almas, MD  - Ortencia Kick, PA - Duwaine Maxin, [place] -  Christella Noa, MD - Donovan Kail, MD, MS, Medical Director - Valora Piccolo, MD, MPH, Director   Diagnostic Polysomnography Interpretation   Patient Name: Andrew Charles Referring provider:   Date of Birth: [birth year] Primary care provider: Joycelyn Rua, MD  Date of Service: 03/03/2023 Sleep Consultant:  Ortencia Kick, PA  [facility] MRN: [id number] Recording tech: Leretha Dykes supervised by Verlene Mayer, RPSGT     INDICATION:This 62 year old male was referred for polysomnography to evaluate for symptoms of insomnia and unrefreshing sleep. The Epworth Sleepiness scale is 4. BMI is 26.PAST MEDICAL HISTORY:Andrew Charles  has a past medical history of Anxiety, Depression, HIV disease (HC Code), and Immune deficiency disorder (HC Code).MEDICATIONS:Current Outpatient Medications:   cephALEXin (KEFLEX) 500 MG capsule, Take 1 capsule (500 mg total) by mouth 4 (four) times daily., Disp: , Rfl:   clonazePAM (KLONOPIN) 1 mg tablet, Take 1 tablet (1 mg total) by mouth 2 (two) times daily., Disp: , Rfl:   dolutegravir-lamivudine (DOVATO) 50-300 mg tablet, Take 1 tablet by mouth daily., Disp: , Rfl:   escitalopram oxalate (LEXAPRO) 10 MG tablet, Take 2 tablets (20 mg total) by mouth Every morning @0700 .., Disp: 60 tablet, Rfl: 2  maraviroc (SELZENTRY) 300 mg Tab, Take 1 tablet (300 mg total) by mouth daily. (Patient not taking: Reported on 03/03/2023), Disp: , Rfl:  TEST CONDITIONS:  Lights out 09:14 PM Lights on 05:20 AM.FIO2: room air KEY FINDINGS (DETAILED DATA BELOW): Sleep: The Total-Sleep Time and Sleep Efficiency were 433.0 minutes and 89.2%, respectively including 51.5 minutes of REM sleep and 433.0 minutes of supine sleep. The sleep latency was 21.0 minutes. REM latency was 386.5 minutes. There was 1 REM periods. Sleep was fragmented with an arousal and awakening index of 11/hr. Time spent in wake-after-sleep onset (WASO) was 30.0 minutes.  Breathing: Snoring was noted 0.0% of time. There were 8 obstructive and  619 central respiratory disturbances. The diagnostic Apnea-Hypopnea Index (AHI) was 87 /hr. (AHI 4% of 87 /hr.). The REM AHI was 66 /hr. The supine AHI was 87 /hr. The CAI was 86 /hr.The CAHI was 86 /hr. With the patient awake and breathing room air, the average arterial oxygen saturation was 97.0%. During NREM and REM sleep the average arterial oxygen saturation was 96% and 96 %, respectively with a nadir arterial oxygen saturation of 92.0%. Time spent below 89% oxygen saturation was 0.0 minutes or 0.0% of total recording time.  Cardiac: Cardiac rhythm was normal sinus rhythm. Mean sleep heart rate was 53 bpm.  Movements: Periodic limb movements in sleep were not observed. There were 0 periodic limb movements during sleep for a periodic limb movement index (PLMI) of 0.0.  IMPRESSION:  Severe Central Sleep Apnea (G47.37): Total AHI was 87 /hr. CAI 86/hr. More than 50% of respiratory events were central in nature. The mean SpO2 96.0%, nadir SpO2 92.0%, 0.0% total recording time with SpO2 <88%, associated  with oxygen desaturations. RECOMMENDATIONS: Given severity of central sleep apnea, a dedicated  in-laboratory positive airway pressure (PAP) titration study is recommended to assess for the most effective PAP modality.The report has been forwarded to the patient's primary sleep medicine consultant, Ortencia Kick, PA.  So the results can be reviewed with the patient and treatment plan discussed. ELECTRONICALLY SIGNED BY Dr Chapman Fitch, May 12, 2023 at 11:23 PMDETAILED DATA: tables and figures below are best viewed in notes tab or encounters tab in electronic medical record rather than procedure tabSleep Summary Sleep Time Statistics Minutes Hours Sleep Disruption Events Count Index Time in Bed 485.5 8.1 Arousals 71 10 Total Sleep Time 433.0 7.2 Awakenings 5 1 Total Sleep Time NREM 381.5 6.4 Arousals and Awakenings 76 11 Total Sleep Time REM 51.5 0.9 . Sleep Latency 21.0 0.3 . Wake after Sleep Onset 30.0 0.5 .  Sleep Efficiency 89.2 Percent .  Sleep Stage  Wake N1 N2 N3 REM . Sleep Period Time in Stage 27.0 38.0 343.5 0.0 51.5 Minutes Latency to Stage . 21.0 28.0 0.0 386.5 Minutes Percent Stage to TST . 8.8 79.3 0.0 11.9 Percent  EKG Summary EKG Statistics. Heart Rate, Wake      53 BPM Heart Rate, Steady Sleep Avg 53 BPM  Movement Summary Periodic Movement Statistics Number of Leg Movements 0 Periodic Movements w/Arousal 0 Periodic Movements w/Awakenings 0 Periodic Movement Index (AVE/HR) 0.0 Periodic Movement Arousal Index 0  Respiratory Summary Event Classification . Central Mixed Obs . Total Total 4 % Central Central 4% Apneas, NREM 568 0 1 Hypopnea, NREM 1 0 0 0 Apneas, REM 51 0 6 Hypopnea, REM 0 0 0 0 Apneas, Total 619 0 7 Hypopneas, Total 1 0 0 0 Event Statistics Total With Arousal  Count . Index . Count Index Apneas, Total 626 . 87 . 35 5 Hypopneas , Total 1 . 0 . 0 0 Events (Apneas + Hypopneas) 627 . 87 . 35 5 Events (Apneas + 4% Hypopneas 626 . 87 . 35 5 Central Events (Apneas +Hypopneas) 619   86   34 5 Central Events (Apneas +4% hypopneas) 619   86   34 5 Snoring   0 % . . . Body Position . Non Supine  Supine    Total    . Non Supine Supine Total Apnea Index, NREM (Sleep Period Time) 0 89 89 A+H Index, NREM (SPT) 0 90 90 Apnea Index, REM (SPT) 0 66 66 A+H Index, REM (SPT) 0 66 66 Apnea Index, Total 0 87 87 A+H Index, Total 0 87 87 Hypopnea Index, NREM (SPT) 0 0 0 RERA Number 0 0 0 Hypopnea Index, REM (SPT) 0 0 0 RDI 0 87 87 Hypopnea Index, total 0 0 0 Sleep Time, Minutes 0.0 433.0 433.0 Oxygen Summary Oximetry Statistics Non REM REM Total SpO2, Mean, Awake Percent . . 97.0 SpO2, Minimum, Percent 93.0 92.0 92.0 SpO2, Range, Percent 93.0 - 99.0 92.0 - 98.0 92.0 - 100.0 SpO2, Mean, Percent 96.0 96.0 96.0 . SpO2 Intervals > 91% 81-90% 71-80% 61-70% < 60% < 88% Percent Recording Time 91.1 0.0 0.0 0.0 0.0 0.0 Response to Oxygen Oxygen  TST SLP Eff Apneas Hypopneas AHI AHI 4% Ar + Aw SpO2  O2 Vol TST Percent Obs Mixed Central Count Central} 4.0 % Index Index Count Index Min Mean 0 433.0 94 7 0 619 1 0 0 87 87  76 11 92 96 Scoring Tech: Katie Lucey, RPSGT(Note that AHI in the Apneas/Hypopneas figure above is not overall  AHI, but is the difference between overall AHI and AHI4%.  All Apneas are displayed in the figure. The Hypopneas that do not meet the Hypopneas4% criteria are displayed in the figure. The Hypopneas4% are displayed in the custom event 6 figure)  PROCEDURE:The patient was monitored for assessment of sleep and respiration using a [employer] polysomnography data acquisition system. This system was used to monitor ECG, EEG (minimum 3 EEG derivations sampling the frontal, central, and occipital lobes), right and left eye movements (E1, E2), chin EMG and right and left tibialis anterior EMG. Arterial oxygen saturation was measured using a pulse oximeter. Nasal pressure transducer and oronasal thermal sensor were used to monitor nasal and oral airflow. Bands were used to monitor ribcage and abdominal excursions. All events were recorded simultaneously at a speed of 20mm/second. The study was collected, scored and interpreted based on the Rules, Terminology and Technical Specification of the 2012 AASM Manual for the Scoring of Sleep and Associated Events. Hypopneas were scored based on recommended (as opposed to acceptable) criteria. DEFINITIONS:Hypopnea is scored when the peak signal excursions drops by ?30% of pre-event baseline using nasal pressure (diagnostic study), PAP device flow (titration study), or an alternative hypopnea sensor for ?10 seconds and is associated with a ?3% oxygen desaturation from pre-event baseline and/or the event is associated with an arousal. Hypopnea4% identifies a hypopnea that has ?4% oxygen desaturation from pre-event baseline.AHI: sum of apneas and hypopneas indexed per hour of sleep AHI4%: sum of apneas and hypopneas4% indexed per hour of sleepCAI: sum of central apneas indexed per hour of sleepArousal/awakening index: sum of arousals and awakenings indexed per hour of sleepPLM index: periodic limb movements indexed per hour of sleepPLM Ar/Aw Index: periodic limb movements associated with arousals and awakenings indexed per hour of sleep ICSD-10 Diagnostic Criteria for OSA in adultsAHI ? 15ORAHI? 5 PLUS symptoms OR comorbidities (Symptoms include: sleepiness, non-restorative sleep, fatigue, or insomnia, waking with breath holding, gasping, or choking, observed habitual snoring and/or breathing interruptions)(Comorbidities include: hypertension, mood disorder, cognitive dysfunction, coronary artery disease, stroke, congestive heart failure, atrial fibrillation, or type 2 diabetes mellitus). Of note, in determining whether PAP is a covered medical expense, some insurance providers use AHI4% as metric (rather than AHI) and use a more limited list of symptoms and comorbidities (excessive daytime sleepiness, impaired cognition, mood disorder, insomnia, hypertension, ischemic heart disease, stroke).

## 2023-05-15 ENCOUNTER — Telehealth: Admit: 2023-05-15 | Payer: PRIVATE HEALTH INSURANCE | Attending: Internal Medicine | Primary: Infectious Disease

## 2023-05-15 DIAGNOSIS — G4731 Primary central sleep apnea: Secondary | ICD-10-CM

## 2023-05-15 NOTE — Telephone Encounter
 Severe Central Sleep Apnea (G47.37): Total AHI was 87 /hr. CAI 86/hr. More than 50% of respiratory events were central in nature. The mean SpO2 96.0%, nadir SpO2 92.0%, 0.0% total recording time with SpO2 <88%, associated with oxygen desaturations.  RECOMMENDATIONS: Given severity of central sleep apnea, a dedicated  in-laboratory positive airway pressure (PAP) titration study is recommended to assess for the most effective PAP modality.I called pt no answer and left a message.

## 2023-05-19 ENCOUNTER — Encounter: Admit: 2023-05-19 | Payer: PRIVATE HEALTH INSURANCE | Attending: Internal Medicine | Primary: Infectious Disease

## 2023-05-22 ENCOUNTER — Ambulatory Visit
Admit: 2023-05-22 | Payer: PRIVATE HEALTH INSURANCE | Attending: Student in an Organized Health Care Education/Training Program | Primary: Infectious Disease

## 2023-05-25 ENCOUNTER — Encounter: Admit: 2023-05-25 | Payer: PRIVATE HEALTH INSURANCE | Primary: Infectious Disease

## 2023-06-08 ENCOUNTER — Encounter: Admit: 2023-06-08 | Payer: PRIVATE HEALTH INSURANCE | Attending: Internal Medicine | Primary: Infectious Disease

## 2023-06-08 NOTE — Telephone Encounter
 Letter sent per provider request.

## 2023-06-11 ENCOUNTER — Encounter: Admit: 2023-06-11 | Payer: PRIVATE HEALTH INSURANCE | Attending: Internal Medicine | Primary: Infectious Disease

## 2023-06-11 NOTE — Progress Notes
Letter Sent.

## 2023-07-08 ENCOUNTER — Ambulatory Visit: Admit: 2023-07-08 | Payer: PRIVATE HEALTH INSURANCE | Primary: Infectious Disease

## 2023-07-22 ENCOUNTER — Telehealth: Admit: 2023-07-22 | Payer: PRIVATE HEALTH INSURANCE | Attending: Internal Medicine | Primary: Infectious Disease

## 2023-07-22 DIAGNOSIS — G4731 Primary central sleep apnea: Secondary | ICD-10-CM

## 2023-07-22 NOTE — Telephone Encounter
 Pt has severe OSA - a titration study showed bipap ST 11/6 BUR 10 I called pt no answer left a message

## 2023-07-22 NOTE — Procedures
 [facility] [address] [place], [place] [id number] [address] ([address]), [place], [place] [address]NFAOZ: [phone] Fax: [name], MD - Caralyn Guile, MD - Hildred Alamin, MD - Lorenza Evangelist, MD, PhD -            	Wonda Amis, MD - Hadassah Pais, MD, - Lolita Lenz PhD - Camille Bal, PsyD, - Arsenio Katz, MD -  Yong Channel - Cheral Almas, MD  - Ortencia Kick, PA - Duwaine Maxin, [place] -  Christella Noa, MD - Donovan Kail, MD, MS, Medical Director - Valora Piccolo, MD, MPH, Director       Therapeutic Polysomnography Interpretation  Patient Name: Andrew Charles Referring provider:  Date of Birth: 1960-10-24 Primary care provider: Joycelyn Rua, MD  Date of Service: 07/08/2023 Sleep Consultant:   Ortencia Kick, PA  [facility] MRN: [id number] Recording Tech: Otila Kluver supervised by Lunette Stands, RPSGT    INDICATION:This 62 year old male was referred for therapeutic polysomnography for titration of positive airway pressure (PAP) therapy for treatment of previously confirmed sleep apnea. The patient was previously diagnosed with severe central sleep apnea based on sleep testing on 03/03/2023 (AHI 87/hr, AHI4% 87, mean SpO2 96 %, nadir SpO2 92 %). Symptoms include snoring, witnessed apnea, insomnia/sleep disruption, and unrefreshing sleep/daytime sleepiness. The Epworth Sleepiness scale is 4. BMI is 21.PAST MEDICAL HISTORY:Andrew Charles  has a past medical history of Anxiety, Depression, HIV disease (HC Code), and Immune deficiency disorder (HC Code).MEDICATIONS:Current Outpatient Medications:   clonazePAM (KLONOPIN) 1 mg tablet, Take 1 tablet (1 mg total) by mouth 2 (two) times daily., Disp: , Rfl:   dolutegravir-lamivudine (DOVATO) 50-300 mg tablet, Take 1 tablet by mouth daily., Disp: , Rfl:   cephALEXin (KEFLEX) 500 MG capsule, Take 1 capsule (500 mg total) by mouth 4 (four) times daily. (Patient not taking: Reported on 07/08/2023), Disp: , Rfl:   escitalopram oxalate (LEXAPRO) 10 MG tablet, Take 2 tablets (20 mg total) by mouth Every morning @0700 .., Disp: 60 tablet, Rfl: 2  maraviroc (SELZENTRY) 300 mg Tab, Take 1 tablet (300 mg total) by mouth daily. (Patient not taking: Reported on 07/08/2023), Disp: , Rfl:   TEST CONDITIONS: Lights out 09:55:PMLights on 05:26:AM. Mode used: CPAP, BiPAP STSettings used: CPAP 5 - 11 cm H2O then BiPAP ST with BUR 10-12 IPAP 8/4 max 11/6 end study on BiPAP ST 9/5 BUR 10Mask(s) used: FFM (1), Nasal (0), Final Mask: ResMed AirFit F20 size Medium Chin strap No used. Heated humidification: Yes FIO2: Room Air  KEY FINDINGS (DETAILED DATA BELOW): Sleep: Overall on all pressures, the Total-Sleep Time and Sleep Efficiency were 410.5 minutes and  91.1% respectively, including 0.0 minutes of REM sleep and 410.5 minutes of supine sleep. The sleep latency was 14.5 minutes .REM latency was 0.0 minutes .There were 0 REM periods. Arousal and awakening index was 19.4 /hr. Time spent in wake-after-sleep onset (WASO) was 25.5 minutes. Breathing: Minor snoring was noted. Overall AHI was 19.0 /hr.and AHI 4% 16.1 /hr.The CAI was 15.3 /hr.The CAHI was 15.3 /hr. Mean SpO2 95.0 % with a nadir SpO2 of 91.0 %. The best response occurred on BiPAP ST 11/6 cmH2O, which did not include REM supine sleep; at this pressure, the following were obtained: Total sleep time 28 min; AHI 8  /hr., AHI 4% 8  /hr., mean SpO2 95  %, nadir SpO2 of 93 %.  Cardiac: Cardiac rhythm was  normal sinus rhythm, Mean sleep heart rate was 57bpm. The following arrhythmias were noted: None  Movements: Periodic limb movements in sleep were not observed.  There were 0 periodic limb movements during sleep for a periodic limb movement index of (PLMI) of 0.0 /hr. The number of arousals related to periodic limb movements (PLMAI) was 0.0 /hr.           IMPRESSION:   - Central Sleep Apnea (G47.37): The patient has a previously documented diagnosis of central sleep apnea as noted above. - Titration: The patient had an excellent response to PAP therapy at a PAP setting of BiPAP ST 11/6 cmH2O BUR 10. RECOMMENDATIONS - Based on this titration study, PAP has been ordered (BiPAP ST 11/6 cmH2O BUR 10) with heated humidification and the interface listed above). After the initiation of PAP therapy the patient will have close follow up with their durable medical equipment company to assist in the acclimation to therapy and will then follow up with their sleep provider. - Follow up will occur with the patient's primary sleep provider, Ortencia Kick, PA-C, to discuss the results of the sleep study and to determine a treatment plan.- Theatre stage manager of Physicians recommendations for treatment of obstructive sleep apnea include 1) weight loss in overweight patients, 2) positive airway pressure (PAP) as initial therapy, and 3) oral appliance therapy as alternative therapy for select patients. Additional treatment options in select patients include nasal expiratory resistance device therapy (EPAP), airway surgery, and positional therapy (i.e. avoiding the supine sleep position).Electronically Signed byJennifer Christain Sacramento, MDFellow Physician, Pulmonary, Critical Care, and Sleep MedicineYale School of MedicineNPI: [id number] ELECTRONICALLY SIGNED BY Dr. Arvil Persons, July 21, 2023 at 11:57 AM Procedure(s): I was present & supervised the entire procedure and agree with the findings and impression as noted by the fellow.ELECTRONICALLY SIGNED BY DR. Arvid Right, MD, July 22, 2023 at 11:31 AM DETAILED DATA: tables and figures below are best viewed in notes tab or encounters tab in electronic medical record rather than procedure tabSleep Summary Sleep Time Statistics Minutes Hours Sleep Disruption Events Count Index Time in Bed 450.5 7.5 Arousals 128 19 Total Sleep Time 410.5 6.8 Awakenings 5 1 Total Sleep Time NREM 410.5 6.8 Arousals and Awakenings 133 19 Total Sleep Time REM 0.0 0.0 . Sleep Latency 14.5 0.2 . Wake after Sleep Onset 25.5 0.4 .  Sleep Efficiency 91.1 Percent .  Sleep Stage  Wake N1 N2 N3 REM . Sleep Period Time in Stage 25.0 25.5 385.0 0.0 0.0 Minutes Latency to Stage . 14.5 16.5 0.0 0.0 Minutes Percent Stage to TST . 6.2 93.8 0.0 0.0 Percent EKG Summary EKG Statistics. Heart Rate, Wake      57 BPM Heart Rate, Steady Sleep Avg 57 BPM Movement Summary Periodic Movement Statistics Number of Leg Movements 0 Periodic Movements w/Arousal 0 Periodic Movements w/Awakenings 0 Periodic Movement Index (AVE/HR) 0.0 Periodic Movement Arousal Index 0 Respiratory Summary Event Classification . Central Mixed Obs . Total Total 4 % Central Central 4% Apneas, NREM 105 0 5 Hypopnea, NREM 20 0 0 0 Apneas, REM 0 0 0 Hypopnea, REM 0 0 0 0 Apneas, Total 105 0 5 Hypopneas, Total 20 0 0 0 Event Statistics Total With Arousal  Count . Index . Count Index Apneas, Total 110 . 16 . 14 2 Hypopneas , Total 20 . 3 . 5 1 Events (Apneas + Hypopneas) 130 . 19 . 19 3 Events (Apneas + 4% Hypopneas 110 . 16 . 14 2 Central Events (Apneas +  Hypopneas) 105   15   13 2  Central Events (Apneas +4% hypopneas) 105   15   13 2  Snoring   0.0 % . . . Body Position . Non Supine  Supine    Total    . Non Supine Supine Total Apnea Index, NREM (Sleep Period Time) 0 16 16 A+H Index, NREM (SPT) 0 19 19 Apnea Index, REM (SPT) 0 0 0 A+H Index, REM (SPT) 0 0 0 Apnea Index, Total 0 16 16 A+H Index, Total 0 19 19 Hypopnea Index, NREM (SPT) 0 3 3 RERA Number 0 0 0 Hypopnea Index, REM (SPT) 0 0 0 RDI 0 19 19 Hypopnea Index, total 0 3 3 Sleep Time, Minutes 0.0 410.5 410.5 Oxygen Summary Oximetry Statistics Non REM REM Total SpO2, Mean, Awake Percent . . 96.0 SpO2, Minimum, Percent 91.0 -- 91.0 SpO2, Range, Percent 91.0 - 98.0 -- - -- 91.0 - 98.0 SpO2, Mean, Percent 95.0 -- 95.0 . SpO2 Intervals > 91% 81-90% 71-80% 61-70% < 60% < 88% Percent Recording Time 96.7 0.0 0.0 0.0 0.0 0.0  Response to Positive Pressure Treatment TST SLP Eff Apneas Hypopneas AHI AHI 4% Ar + Aw SpO2 IPAP/EPAP  O2 Vol TST Percent Obstructive  Mixed Central Count Central} 4.0 % Index Index Count Index Min Mean 00 0.0 0.0 0.0 0 0 0 0 0.0 0 0 0 0 0 -- -- 08 / 04 0.0 14.0 100.0 0 0 1 1 0.0 0 9 4 1 4  93 95 09 / 04 0.0 66.0 96.4 1 0 8 1 0.0 0 9 8 15 14  92 95 10 / 04 0.0 18.0 100.0 0 0 0 7 0.0 0 23 0 13 43 92 95 05 0.0 27.0 65.1 0 0 17 0 0.0 0 38 38 3 7 93 95 09 / 05 0.0 5.5 84.6 0 0 4 0 0.0 0 44 44 3 33 93 95 10 / 05 0.0 103.0 95.8 0 0 3 5 0.0 0 5 2 35 20 92 95 10 / 05 0.0 67.5 89.4 0 0 3 3 0.0 0 5 3 31 28  91 95 11 / 06 0.0 14.5 72.5 0 0 2 0 0.0 0 8 8 5 21  93 95 11 / 06 0.0 13.5 96.4 4 0 0 3 0.0 0 31 18 9  40 93 95 07 0.0 21.5 86.0 0 0 17 0 0.0 0 47 47 5 14 93 95 09 0.0 36.5 100.0 0 0 23 0 0.0 0 38 38 3 5 92 94 11 0.0 23.5 100.0 0 0 27 0 0.0 0 69 69 10 26 92 95  Scoring Tech: Katie Lucey, RPSGT (note that AHI in the Apneas/Hypopneas figure above is not overall AHI, but is the difference between overall AHI and AHI4%.  All Apneas are displayed in the figure. The Hypopneas that do not meet the Hypopneas4%  criteria are displayed in the figure. The Hypopneas4% are displayed in the custom event 6 figure)  PROCEDURE:The patient was monitored for assessment of sleep and respiration using a [employer] polysomnography data acquisition system. This system was used to monitor ECG, EEG (minimum 3 EEG derivations sampling the frontal, central, and occipital lobes), right and left eye movements (E1, E2), chin EMG and right and left tibialis anterior EMG. Arterial oxygen saturation was measured using a pulse oximeter. Nasal pressure transducer and oronasal thermal sensor were used to monitor nasal and oral airflow. Bands were used to monitor ribcage and abdominal excursions. All events were recorded simultaneously  at a speed of 52mm/second. The study was collected, scored and interpreted based on the Rules, Terminology and Technical Specification of the 2012 AASM Manual for the Scoring of Sleep and Associated Events. Hypopneas were scored based on Recommended (as opposed to Acceptable) criteria. DEFINITIONS:Hypopneais scored when the peak signal excursions drops by >=30% of pre-event baseline using nasal pressure (diagnostic study), PAP device flow (titration study), or an alternative hypopnea sensor for >=10 seconds and is associated with a >=3% oxygen desaturation from pre-event baseline and/or the event is associated with an arousal. Hypopnea4% identifies a hypopnea that has >=4% oxygen desaturation from pre-event baseline.AHI: sum of apneas and hypopneas indexed per hour of sleep AHI4%: sum of apneas and hypopneas4% indexed per hour of sleepCAI: sum of central apneas indexed per hour of sleepArousal/awakening index: sum of arousals and awakenings indexed per hour of sleepPLM index: periodic limb movements indexed per hour of sleepPLM Ar/Aw Index: periodic limb movements associated with arousals and awakenings indexed per hour of sleep

## 2023-08-04 ENCOUNTER — Encounter: Admit: 2023-08-04 | Payer: PRIVATE HEALTH INSURANCE | Primary: Infectious Disease

## 2023-08-04 NOTE — Telephone Encounter
 Received call from patient regarding results and next steps. Provided patient with results of recent PSG/recommendations per Ortencia Kick, P.A. (see note).  All questions/concerns addressed.  Patient was made aware that the Durable Medical Equipment (DME) company will be in contact within the next 4-6 weeks to set-up delivery.  Informed there are delays in set up due to limited device availability. He was also made aware that he will need a f/u visit 31- 90 days after he receives his machine.  Insurance compliance guidelines and annual visit requirement also discussed with patient. Patient verbalized understanding. Next Steps message sent via MyChart.

## 2023-12-21 ENCOUNTER — Ambulatory Visit: Admit: 2023-12-21 | Payer: PRIVATE HEALTH INSURANCE | Attending: Pulmonary Disease | Primary: Infectious Disease

## 2023-12-21 DIAGNOSIS — G4731 Primary central sleep apnea: Secondary | ICD-10-CM

## 2023-12-21 NOTE — Progress Notes
 [place] [facility] [address] [place], [place] [address]UEAVW: [phone]         Fax: [phone] Amye Baller, MD - Kym Phoenix, MD  - Jeannetta Millman, MD, PhD -  Dellar Fenton, MD Lorriane Rote, MD - Catha Clink, PA-C - Blondie Burke, PA-C - Latricia Poles, PhD - Jil Mosses, PsyDSritika Aretha Kubas, MD - Algernon Ing, MD - Alfonzo Ill, MD - Alejos Amsterdam, MD Saddie Crane, MD, MS, Medical Director - George Kinder, MD, MPH, DirectorPatient Name:  Andrew Charles of Birth:  06-Jan-1962Date of Service: 4/28/2025EPIC MRN:  [id number] Provider:  Conny Del, MDHistory Of Present Illness: [name]  is a 63 y.o.-year-old male who has a h/o severe CSA likely related to chronic opioid use (for pain) and has been started on PAP therapy and is here for their f/u per insurance requirements. He was initially using nightly and benefiting. More recently, he noticed difficulty w/ tolerance, feeling like he couldn't breathe and claustrophobic.He has not had any changes in his medications nor does he have any known cardiac disease. Past Medical History : He  has a past medical history of Anxiety, Depression, HIV disease, and Immune deficiency disorder (HC Code).  Social History:   He  reports that he has quit smoking. His smoking use included cigarettes. He does not have any smokeless tobacco history on file. He reports that he does not drink alcohol and does not use drugs. Family History:   He family history includes ADHD in his brother; Anxiety disorder in his sister; Bipolar disorder in his brother; Depression in his sister. Allergies: He is allergic to sulfa (sulfonamide antibiotics). Medications: Outpatient Encounter Medications as of 12/21/2023 Medication Sig Dispense Refill  cephALEXin (KEFLEX) 500 MG capsule Take 1 capsule (500 mg total) by mouth 4 (four) times daily. (Patient not taking: Reported on 07/08/2023)    clonazePAM  (KLONOPIN ) 1 mg tablet Take 1 tablet (1 mg total) by mouth 2 (two) times daily.    dolutegravir-lamivudine (DOVATO ) 50-300 mg tablet Take 1 tablet by mouth daily.    escitalopram oxalate (LEXAPRO) 10 MG tablet Take 2 tablets (20 mg total) by mouth Every morning @0700 .. 60 tablet 2  maraviroc (SELZENTRY) 300 mg Tab Take 1 tablet (300 mg total) by mouth daily. (Patient not taking: Reported on 07/08/2023)   No facility-administered encounter medications on file as of 12/21/2023.  Examination : Vital signs: There were no vitals taken for this visit. There is no height or weight on file to calculate BMI.   02/13/2023   8:00 AM Neck Circumference  Neck Circumference 16.5  General: well appearing, nad, no conversational or ambulatory dyspnea Epworth Sleepiness   02/10/2023   1:57 PM Epworth Sleepiness Scale 1. Sitting and reading 1-Slight chance of dozing 2. Watching TV 1-Slight chance of dozing 3. Sitting, inactive in a public place (e.g. a theatre or a meeting) 0-Would never doze 4. As a passenger in a car for an hour without a break 1-Slight chance of dozing 5. Lying down to rest in the afternoon when circumstances permit 1-Slight chance of dozing 6. Sitting and talking to someone 0-Would never doze 7. Sitting quietly after a lunch without alcohol 0-Would never doze 8. In a car, while stopped for a few minutes in traffic 0-Would never doze Epworth Total Score 4  COMPLIANCE DATA IN MEDIA TAB HAS BEEN REVIEWEDSLEEP STUDY RESULTS HAVE BEEN REVIEWEDImpression/Plan:This is a 63 y.o.-year-old male with a h/o OSA  initiated on PAP therapy her for 31-90 d adherence follow up. During this visit, we reviewed operation, safety, disinfecting/cleaning, and maintenance of PAP unit and supplies. Opportunity given to pt to ask questions regarding operation, safety, cleaning, and maintenance of PAP unit and supplies. Patient verbally acknowledged their understanding & responsibilities.  DME contact information provided for any issues related to therapy including mask fitting, operation, machine safety, and supplies. Discussed compliance guidelines.  All questions were answered.[]   The patient is compliant with PAP per CMS criteria, and benefiting with improved sleep quality and/or daytime function. Continue PAP therapy and f/u with provider in 9-12 months.[x]  The patient is NOT compliant with PAP per CMS criteria. The following issues and interventions were discussed, and the patient was referred to the [facility]AP support group for further management and referred to their DME for further support and assistance. The patient requires an extended trial of PAP therapy based on their severity of sleep apnea, poor sleep, daytime dysfunction, and/or medical co-morbidities. They will be reassessed in another 2-4 months for compliance and benefit. [x]  PSG needed to Re-qualify[]  Poor mask fit/mask leak[]  Dry Mouth[x]  Pressure intolerance [x]  Claustrophobia or other psychological/behavioral barriers[]  Extenuating circumstances[]  OtherCurrently on BPAP- ST 11/6 BUR 10Will re-do split study to 1) eval if persistent CSA, and 2) re-titrate given new intolerance to [name] working on [name].ELECTRONICALLY SIGNED BY DR. Conny Del, MD, December 21, 2023 at 10:56 AM VIDEO TELEHEALTH VISIT: This clinician is part of the telehealth program and is conducting this visit in a currently approved location. For this visit the clinician and patient were present via interactive audio & video telecommunications system that permits real-time communications, via the D[employer] platform.Patient's use of the telehealth platform followed consent and acknowledges agreement to permit telehealth for this visit. [place] patient is located in: [place] clinician is appropriately licensed in the above state to provide care for this visit. Other individuals present during the telehealth encounter and their role/relation: noneBecause this visit was completed over video, a hands-on physical exam was not performed. Patient/parent or guardian understands and knows to call back if condition changes.

## 2024-01-07 ENCOUNTER — Encounter: Admit: 2024-01-07 | Payer: PRIVATE HEALTH INSURANCE | Attending: Pulmonary Disease | Primary: Infectious Disease

## 2024-02-24 ENCOUNTER — Inpatient Hospital Stay: Admit: 2024-02-24 | Discharge: 2024-02-24 | Payer: Medicare (Managed Care) | Attending: Emergency Medicine

## 2024-02-24 LAB — CBC WITH AUTO DIFFERENTIAL
BKR WAM ABSOLUTE IMMATURE GRANULOCYTES.: 0.02 x 1000/ÂµL (ref 0.00–0.30)
BKR WAM ABSOLUTE LYMPHOCYTE COUNT.: 1.85 x 1000/ÂµL (ref 0.60–3.70)
BKR WAM ABSOLUTE NRBC: 0 x 1000/ÂµL (ref 0.00–1.00)
BKR WAM ANC (ABSOLUTE NEUTROPHIL COUNT): 4.2 x 1000/ÂµL (ref 2.00–7.60)
BKR WAM BASOPHIL ABSOLUTE COUNT.: 0.06 x 1000/ÂµL (ref 0.00–1.00)
BKR WAM BASOPHILS: 0.9 % (ref 0.0–1.4)
BKR WAM EOSINOPHIL ABSOLUTE COUNT.: 0.11 x 1000/ÂµL (ref 0.00–1.00)
BKR WAM EOSINOPHILS: 1.6 % (ref 0.0–5.0)
BKR WAM HEMATOCRIT: 36.8 % — ABNORMAL LOW (ref 38.50–50.00)
BKR WAM HEMOGLOBIN: 11.9 g/dL — ABNORMAL LOW (ref 13.2–17.1)
BKR WAM IMMATURE GRANULOCYTES: 0.3 % (ref 0.0–1.0)
BKR WAM LYMPHOCYTES: 26.6 % (ref 17.0–50.0)
BKR WAM MCH: 29.3 pg (ref 27.0–33.0)
BKR WAM MCHC: 32.3 g/dL (ref 31.0–36.0)
BKR WAM MCV: 90.6 fL (ref 80.0–100.0)
BKR WAM MONOCYTE ABSOLUTE COUNT.: 0.71 x 1000/ÂµL (ref 0.00–1.00)
BKR WAM MONOCYTES: 10.2 % (ref 4.0–12.0)
BKR WAM MPV: 9.4 fL (ref 8.0–12.0)
BKR WAM NEUTROPHILS: 60.4 % (ref 39.0–72.0)
BKR WAM NUCLEATED RED BLOOD CELLS: 0 % (ref 0.0–1.0)
BKR WAM PLATELETS: 204 x1000/ÂµL (ref 150–420)
BKR WAM RDW-CV: 14.2 % (ref 11.0–15.0)
BKR WAM RED BLOOD CELL COUNT.: 4.06 M/ÂµL (ref 4.00–6.00)
BKR WAM WHITE BLOOD CELL COUNT: 7 x1000/ÂµL (ref 4.0–11.0)

## 2024-02-24 LAB — COMPREHENSIVE METABOLIC PANEL
BKR A/G RATIO: 2 (ref 1.0–2.2)
BKR ALANINE AMINOTRANSFERASE (ALT): 14 U/L (ref 9–59)
BKR ALBUMIN: 4.2 g/dL (ref 3.6–5.1)
BKR ALKALINE PHOSPHATASE: 56 U/L (ref 9–122)
BKR ANION GAP: 9 (ref 7–17)
BKR ASPARTATE AMINOTRANSFERASE (AST): 19 U/L (ref 10–35)
BKR AST/ALT RATIO: 1.4
BKR BILIRUBIN TOTAL: 1.1 mg/dL (ref <=1.2)
BKR BLOOD UREA NITROGEN: 21 mg/dL (ref 8–23)
BKR BUN / CREAT RATIO: 17.8 (ref 8.0–23.0)
BKR CALCIUM: 8.6 mg/dL — ABNORMAL LOW (ref 8.8–10.2)
BKR CHLORIDE: 107 mmol/L (ref 98–107)
BKR CO2: 26 mmol/L (ref 20–30)
BKR CREATININE: 1.18 mg/dL (ref 0.40–1.30)
BKR EGFR, CREATININE (CKD-EPI 2021): >60 mL/min/1.73m2 (ref >=60)
BKR GLOBULIN: 2.1 g/dL (ref 2.0–3.9)
BKR GLUCOSE: 82 mg/dL (ref 70–100)
BKR POTASSIUM: 4.2 mmol/L (ref 3.3–5.3)
BKR PROTEIN TOTAL: 6.3 g/dL (ref 5.9–8.3)
BKR SODIUM: 142 mmol/L (ref 136–144)

## 2024-02-24 LAB — MAGNESIUM: BKR MAGNESIUM: 2.2 mg/dL (ref 1.7–2.4)

## 2024-02-24 LAB — TSH W/REFLEX TO FT4     (BH GH LMW Q YH): BKR THYROID STIMULATING HORMONE: 3.03 u[IU]/mL

## 2024-02-24 NOTE — ED Provider Notes
 Chief Complaint Patient presents with  Dizziness   lightheadedness/dizziness x2 wks. also reporting low BP readings at home 80s/50s. HPI:  Patient is a 63 year old male with a past medical history of anxiety, depression, HIV, HLD, presenting to the ED with a chief complaint of dizziness.Patient endorses he went to his primary medical doctor 2 weeks prior and noted to have a low blood pressure, he was instructed to pick up blood pressure cuff and has been checking his blood pressures at home noting them to be in the 80s over 50s dropping sometimes as low as the 60s systolic.He states he is only typically lightheaded upon wakening and standing up in the morning.  He does not note any other positional lightheadedness or dizziness.He does note he had a low-grade fever to 101 on Sunday with some generalized myalgias that resolved after taking Tylenol.  He states over the last 8 months he has had an intentional 50 lb weight loss.He endorses the only recent changes to his medications are finasteride and minoxidil taken for hair loss since the weight loss occurred.He denies any associated cough, congestion, shortness of breath, chest pain or palpitations, abdominal pain, nausea or vomiting, constipation or diarrheaMDM:Patient is a 63 year old male with a past medical history of anxiety, depression, HIV, HLD, presenting to the ED with a chief complaint of dizziness.  Intermittent lightheadedness primarily in the mornings in 2 weeks of low blood pressures while checking them on a new BP cuff at home.  ROS otherwise negative as noted in HPI.  Lungs are clear to auscultation bilaterally, his heart is regular in rate and rhythm his abdomen is soft and nontender he reports no acute complaints currently BP on arrival normotensive 125/78.DDX:  Dehydration, electrolyte abnormality, orthostasis, less likely arrhythmia, considered in proper use of blood pressure cuff at home, thyroid dysfunction, considered but low concern for an acute/occult infectious process given ROS and exam otherwise reassuring.  We discussed that he had a CD4 count repeated that has appointment 2 weeks prior and he was told it was reassuring as well we will defer repeating it todayPlan:  CBC, CMP, Mag, TSH, ECG------------------ECG sinus CBC, CMP unremarkable Mag within normal limits, TSH within normal limits Orthostatics within normal limits and patient remained asymptomatic while here performing them.  On multiple rechecks he has not been significantly hypotensive and he is otherwise without complaint, well-appearing.  We reviewed use of his blood pressure cuff at home and to ensure that it was the correct size and we discussed return precautions extensively.  Endorses he will follow up with the his primary medical doctor expeditiously, plan to dischargeNo orders to display Meds:Medications - No data to display Medication List  You have not been prescribed any medications. Dr. Viviana was available for consult.Discussed with Dr. Randel Graft, PA-CPast Medical History[1]Past Surgical History[2] Physical ExamED Triage Vitals [02/24/24 0935]BP: 125/78Pulse: 77Pulse from  O2 sat: n/aResp: 16Temp: 98.5 ?F (36.9 ?C)Temp src: OralSpO2: 96 % BP 109/69  - Pulse 64  - Temp 97.9 ?F (36.6 ?C)  - Resp 17  - Wt 68 kg  - SpO2 97%  - BMI 20.91 kg/m? Physical ExamVitals and nursing note reviewed. Constitutional:     General: He is not in acute distress.   Appearance: Normal appearance. He is normal weight. He is not toxic-appearing. HENT:    Head: Normocephalic and atraumatic.    Mouth/Throat:    Mouth: Mucous membranes are dry.    Pharynx: Oropharynx is clear. Eyes:    Pupils: Pupils are equal,  round, and reactive to light. Cardiovascular:    Rate and Rhythm: Normal rate and regular rhythm.    Pulses: Normal pulses.    Heart sounds: Normal heart sounds. No murmur heard.   No friction rub. No gallop. Pulmonary:    Effort: Pulmonary effort is normal.    Breath sounds: Normal breath sounds. No wheezing, rhonchi or rales. Abdominal:    General: Abdomen is flat. Bowel sounds are normal. There is no distension.    Palpations: Abdomen is soft.    Tenderness: There is no abdominal tenderness. There is no right CVA tenderness, left CVA tenderness, guarding or rebound. Musculoskeletal:       General: Normal range of motion.    Cervical back: Normal range of motion and neck supple.    Right lower leg: No edema.    Left lower leg: No edema. Skin:   General: Skin is warm and dry.    Capillary Refill: Capillary refill takes less than 2 seconds. Neurological:    General: No focal deficit present.    Mental Status: He is alert and oriented to person, place, and time. Mental status is at baseline.  ProceduresAttestation/Critical CareComments as of 02/24/24 1709 Wed Feb 24, 2024 1052 ED Attestation: PA/APRNFace to face evaluation was performed by me in collaboration with the Advanced Practice Provider to assess for significant health threats. I provided a substantive portion of the care of this patient.  I personally performed medically appropriate history and physical exam and the MDM: On my exam:My differential includes:Additional acute and/or chronic problems addressed:Rashi Giuliani Orlan Moss yo M HIV, depression, anxietyLost 50lbs x 8 months, intentional per patient, compliant w/his medications on mounjarno. Admit to taking minoxidil and finasteride 2-24months ago, decreased taking finasteride from 10mg  to 5mg  as of 2 days ago after discussion with primary care doctor. Here, patient is well-appearing, in no acute distress.  Heart regular rate and rhythm, lungs clear bilaterally, abdomen is soft.  Patient's blood pressure has been within normal range without any intervention in the emergency department.  We contacted patient's primary care doctor Dr. Herschel office, with no call back. [GP] 1332 Orthostatics; patient asymptomatic while these were being performed111/68, HR 66 lying down 111/74 HR 73 sitting 102/57 HR 71 standing [KS] 1353 Discussed exam findings, and/or lab results with patient. Follow up with primary doctor and specialists along with return to ED warnings given including worsening/persistent pain, fever, chills, nausea, vomiting, headaches, back pain, or any new concerns of symptoms. All questions and concerns addressed. Upon shared decision making with the patient, patient understands and agrees with the plan of discharge.  [GP]  Comments User Index[GP] Effrey Davidow, Bennett Orlan, DO[KS] Janalee Duncans, [place]   Clinical Impressions as of 02/24/24 1709 Intermittent lightheadedness Medication side effect, sequela  ED DispositionDischarge  Janalee Duncans, PA07/02/25 1338 [1] Past Medical History:Diagnosis Date  Anxiety   Depression   HIV disease   Immune deficiency disorder (HC Code)  [2] No past surgical history on file. Viviana Bennett Orlan, DO07/02/25 1709

## 2024-02-24 NOTE — ED Notes
 10:02 AM 63 Yo male comes in with chief complaint of blood pressure being low at home. Pt states when he went to his primary care provider a few weeks ago and was told he had low bp and was told to get a blood pressure cuff at home. Pt states he has been taking his blood pressure at home and states that his systolic bp is in the 60's, Pt denies chest pain, shortness of breath, pt denies fever and chills but states he felt like he had a fever a few days ago and he took tylenol which made him feel better. Pt denies current dizziness, headache, nausea, vomiting. Pt states the only time he feels dizzy is when he gets up in the morning but no dizziness during the day. Pt states his primary told him he could have pulled a muscle in his chest and was given flexeril  which the patients states made his chest pain go away. 10:32 AM EKG completed 11:22 AM Blood work collected, and ortho completed see flowsheet 1:57 PM No piv, vitals obtained, and dc instructions given to pt and explained. Pt ambulated out of the department with a steady gait. Past Medical History: Diagnosis Date  Anxiety   Depression   HIV disease   Immune deficiency disorder (HC Code)

## 2024-02-24 NOTE — Discharge Instructions
 Return to the emergency department if you develop persistently low blood pressures, severe headache, dizziness, loss of consciousness, fevers or chills, chest pain, palpitations or shortness of breath or any other new, worsening or persistent symptoms.Please follow up closely with your primary medical doctor in the next 1-2 days
